# Patient Record
Sex: Female | Born: 2000 | Race: White | Hispanic: No | Marital: Single | Smoking: Never smoker
Health system: Southern US, Community
[De-identification: ages and names within clinical notes are randomized; demographics above are authoritative.]

## PROBLEM LIST (undated history)

## (undated) DIAGNOSIS — N390 Urinary tract infection, site not specified: Secondary | ICD-10-CM

## (undated) DIAGNOSIS — F909 Attention-deficit hyperactivity disorder, unspecified type: Secondary | ICD-10-CM

## (undated) HISTORY — PX: TONSILLECTOMY: SUR1361

---
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
## 2018-09-11 DEATH — deceased
